# Patient Record
Sex: Female | Born: 1950 | Race: Black or African American | Hispanic: No | Marital: Married
Health system: Southern US, Community
[De-identification: ages and names within clinical notes are randomized; demographics above are authoritative.]

---

## 2017-09-27 ENCOUNTER — Encounter (HOSPITAL_COMMUNITY): Payer: Self-pay | Admitting: Emergency Medicine

## 2017-09-27 ENCOUNTER — Emergency Department (HOSPITAL_COMMUNITY)
Admission: EM | Admit: 2017-09-27 | Discharge: 2017-09-27 | Disposition: A | Discharge status: Home / Self Care | Payer: No Typology Code available for payment source | Attending: Emergency Medicine | Admitting: Emergency Medicine

## 2017-09-27 ENCOUNTER — Emergency Department (HOSPITAL_COMMUNITY): Payer: No Typology Code available for payment source

## 2017-09-27 DIAGNOSIS — S46912A Strain of unspecified muscle, fascia and tendon at shoulder and upper arm level, left arm, initial encounter: Secondary | ICD-10-CM

## 2017-09-27 DIAGNOSIS — S161XXA Strain of muscle, fascia and tendon at neck level, initial encounter: Secondary | ICD-10-CM | POA: Diagnosis not present

## 2017-09-27 DIAGNOSIS — Y998 Other external cause status: Secondary | ICD-10-CM | POA: Diagnosis not present

## 2017-09-27 DIAGNOSIS — S4992XA Unspecified injury of left shoulder and upper arm, initial encounter: Secondary | ICD-10-CM | POA: Diagnosis present

## 2017-09-27 DIAGNOSIS — Y939 Activity, unspecified: Secondary | ICD-10-CM | POA: Diagnosis not present

## 2017-09-27 DIAGNOSIS — Y92410 Unspecified street and highway as the place of occurrence of the external cause: Secondary | ICD-10-CM | POA: Insufficient documentation

## 2017-09-27 MED ORDER — OXYCODONE-ACETAMINOPHEN 5-325 MG PO TABS
1.0000 | ORAL_TABLET | Freq: Once | ORAL | Status: AC
  Administered 2017-09-27: 1 via ORAL
  Filled 2017-09-27: qty 1

## 2017-09-27 MED ORDER — OXYCODONE HCL 5 MG PO TABS: 5.0000 mg | ORAL_TABLET | Freq: Four times a day (QID) | ORAL | 0 refills | Status: AC | PRN

## 2017-09-27 MED ORDER — CYCLOBENZAPRINE HCL 5 MG PO TABS: 5.0000 mg | ORAL_TABLET | Freq: Three times a day (TID) | ORAL | 0 refills | Status: AC | PRN

## 2017-09-27 NOTE — ED Notes (Signed)
Bed: WA02 Expected date:  Expected time:  Means of arrival:  Comments: 
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
## 2017-09-27 NOTE — ED Notes (Signed)
Patient verbalized understanding of discharge instructions, no questions. Patient out of ED via wheelchair in no distress.  

## 2017-09-27 NOTE — Discharge Instructions (Signed)
Take motrin for pain.   Take flexeril for muscle spasms.   Expect to be stiff and sore for several days.   Take oxycodone only if you have severe pain. Do NOT drive with it.   See your doctor  Return to ER if you have worse abdominal pain, chest pain, trouble breathing, back pain, headaches, vomiting.

## 2017-09-27 NOTE — ED Notes (Signed)
Patient tearful when attempting to start IV, Dr. Silverio LayYao made aware. Will hold off on blood work at this time.
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
